# Patient Record
Sex: Male | Born: 1990 | Race: Black or African American | Hispanic: No | Marital: Single | Smoking: Never smoker
Health system: Southern US, Community
[De-identification: ages and names within clinical notes are randomized; demographics above are authoritative.]

---

## 2017-01-19 ENCOUNTER — Emergency Department (HOSPITAL_BASED_OUTPATIENT_CLINIC_OR_DEPARTMENT_OTHER): Payer: Managed Care, Other (non HMO)

## 2017-01-19 ENCOUNTER — Other Ambulatory Visit: Payer: Self-pay

## 2017-01-19 ENCOUNTER — Encounter (HOSPITAL_BASED_OUTPATIENT_CLINIC_OR_DEPARTMENT_OTHER): Payer: Self-pay

## 2017-01-19 ENCOUNTER — Emergency Department (HOSPITAL_BASED_OUTPATIENT_CLINIC_OR_DEPARTMENT_OTHER)
Admission: EM | Admit: 2017-01-19 | Discharge: 2017-01-19 | Disposition: A | Discharge status: Home / Self Care | Payer: Managed Care, Other (non HMO) | Attending: Emergency Medicine | Admitting: Emergency Medicine

## 2017-01-19 DIAGNOSIS — M25572 Pain in left ankle and joints of left foot: Secondary | ICD-10-CM

## 2017-01-19 DIAGNOSIS — R2242 Localized swelling, mass and lump, left lower limb: Secondary | ICD-10-CM | POA: Diagnosis not present

## 2017-01-19 MED ORDER — NAPROXEN 500 MG PO TABS: 500.0000 mg | ORAL_TABLET | Freq: Two times a day (BID) | ORAL | 0 refills | Status: DC

## 2017-01-19 NOTE — ED Provider Notes (Signed)
[facility] EMERGENCY DEPARTMENT Provider Note   CSN: [id number] Arrival date & time: 01/19/17  2245     History   Chief Complaint Chief Complaint  Patient presents with  . Ankle Pain    HPI Joel Yang is a 26 y.o. male.  Patient presents with acute onset of left ankle pain over the outside of his ankle causing him to limp starting upon waking this morning.  Patient denies any acute injury.  No redness or swelling.  No knee pain or hip pain.  No treatments prior to arrival.  Patient denies any acute injuries or overuse, strenuous activities recently.  No history of gout. The course is constant. Aggravating factors: bearing weight. Alleviating factors: none.        History reviewed. No pertinent past medical history.  There are no active problems to display for this patient.   History reviewed. No pertinent surgical history.     Home Medications    Prior to Admission medications   Not on File    Family History No family history on file.  Social History Social History   Tobacco Use  . Smoking status: Never Smoker  . Smokeless tobacco: Never Used  Substance Use Topics  . Alcohol use: No    Frequency: Never  . Drug use: No     Allergies   Patient has no known allergies.   Review of Systems Review of Systems  Constitutional: Negative for activity change.  Musculoskeletal: Positive for arthralgias and gait problem. Negative for back pain, joint swelling and neck pain.  Skin: Negative for wound.  Neurological: Negative for weakness and numbness.     Physical Exam Updated Vital Signs BP (!) 155/93 (BP Location: Left Arm)   Pulse 86   Temp 98.3 F (36.8 C) (Oral)   Resp 20   Ht 5\' 6"  (1.676 m)   Wt (!) 187.5 kg (413 lb 6.4 oz)   SpO2 98%   BMI 66.72 kg/m   Physical Exam  Constitutional: He appears well-developed and well-nourished.  HENT:  Head: Normocephalic and atraumatic.  Eyes: Conjunctivae are normal.  Neck: Normal  range of motion. Neck supple.  Cardiovascular:  Pulses:      Dorsalis pedis pulses are 2+ on the right side, and 2+ on the left side.       Posterior tibial pulses are 2+ on the right side, and 2+ on the left side.  Pulmonary/Chest: No respiratory distress.  Musculoskeletal: He exhibits edema and tenderness.  Neurological: He is alert.  Distal motor, sensation, and vascular intact.  Skin: Skin is warm and dry.  Psychiatric: He has a normal mood and affect.  Vitals reviewed.    ED Treatments / Results   Radiology Dg Ankle Complete Left  Result Date: 01/19/2017 CLINICAL DATA:  Pain EXAM: LEFT ANKLE COMPLETE - 3+ VIEW COMPARISON:  None. FINDINGS: Frontal, oblique, and lateral views were obtained. No fracture or joint effusion. No appreciable joint space narrowing or erosion. There is a small posterior calcaneal spur. The ankle mortise appears intact. IMPRESSION: Small posterior calcaneal spur. No fracture or apparent arthropathy. Ankle mortise appears intact. Electronically Signed   By: Bretta BangWilliam  Woodruff III M.D.   On: 01/19/2017 23:31    Procedures Procedures (including critical care time)  Medications Ordered in ED Medications - No data to display   Initial Impression / Assessment and Plan / ED Course  I have reviewed the triage vital signs and the nursing notes.  Pertinent labs & imaging results  that were available during my care of the patient were reviewed by me and considered in my medical decision making (see chart for details).     Patient seen and examined. Work-up initiated.  Vital signs reviewed and are as follows: BP (!) 155/93 (BP Location: Left Arm)   Pulse 86   Temp 98.3 F (36.8 C) (Oral)   Resp 20   Ht 5\' 6"  (1.676 m)   Wt (!) 187.5 kg (413 lb 6.4 oz)   SpO2 98%   BMI 66.72 kg/m   11:44 PM patient updated on results.  Will provide with Ace wrap prior to discharge.  Encourage PCP follow-up in 1 week if symptoms are not improved.  Home with NSAIDs, rice  protocol.  Final Clinical Impressions(s) / ED Diagnoses   Final diagnoses:  Acute left ankle pain   Patient with acute onset of ankle pain, no injury, negative imaging.  Likely overuse injury.  No signs of infection or joint infection.  Lower extremity is neurovascularly intact.  ED Discharge Orders        Ordered    naproxen (NAPROSYN) 500 MG tablet  2 times daily     01/19/17 2341       Renne CriglerGeiple, Giana Castner, PA-C 01/19/17 2345    Palumbo, April, MD 01/20/17 [phone]

## 2017-01-19 NOTE — ED Triage Notes (Signed)
Pt states he woke today with pain to left ankle-denies injury-NAD-steady limping gait

## 2017-01-19 NOTE — Discharge Instructions (Signed)
Please read and follow all provided instructions.  Your diagnoses today include:  1. Acute left ankle pain     Tests performed today include:  An x-ray of your ankle - does NOT show any broken bones  Vital signs. See below for your results today.   Medications prescribed:   Naproxen - anti-inflammatory pain medication  Do not exceed 500mg  naproxen every 12 hours, take with food  You have been prescribed an anti-inflammatory medication or NSAID. Take with food. Take smallest effective dose for the shortest duration needed for your pain. Stop taking if you experience stomach pain or vomiting.   Take any prescribed medications only as directed.  Home care instructions:   Follow any educational materials contained in this packet  Follow R.I.C.E. Protocol:  R - rest your injury   I  - use ice on injury without applying directly to skin  C - compress injury with bandage or splint  E - elevate the injury as much as possible  Follow-up instructions: Please follow-up with your primary care provider if you continue to have significant pain or trouble walking in 1 week. In this case you may have a severe sprain that requires further care.   Return instructions:   Please return if your toes are numb or tingling, appear gray or blue, or you have severe pain (also elevate leg and loosen splint or wrap)  Please return to the Emergency Department if you experience worsening symptoms.   Please return if you have any other emergent concerns.  Additional Information:  Your vital signs today were: BP (!) 155/93 (BP Location: Left Arm)    Pulse 86    Temp 98.3 F (36.8 C) (Oral)    Resp 20    Ht 5\' 6"  (1.676 m)    Wt (!) 187.5 kg (413 lb 6.4 oz)    SpO2 98%    BMI 66.72 kg/m  If your blood pressure (BP) was elevated above 135/85 this visit, please have this repeated by your doctor within one month. --------------

## 2017-08-13 ENCOUNTER — Other Ambulatory Visit: Payer: Self-pay

## 2017-08-13 ENCOUNTER — Encounter (HOSPITAL_BASED_OUTPATIENT_CLINIC_OR_DEPARTMENT_OTHER): Payer: Self-pay | Admitting: *Deleted

## 2017-08-13 ENCOUNTER — Emergency Department (HOSPITAL_BASED_OUTPATIENT_CLINIC_OR_DEPARTMENT_OTHER)
Admission: EM | Admit: 2017-08-13 | Discharge: 2017-08-13 | Disposition: A | Discharge status: Home / Self Care | Payer: Managed Care, Other (non HMO) | Attending: Emergency Medicine | Admitting: Emergency Medicine

## 2017-08-13 DIAGNOSIS — Z79899 Other long term (current) drug therapy: Secondary | ICD-10-CM | POA: Diagnosis not present

## 2017-08-13 DIAGNOSIS — R0981 Nasal congestion: Secondary | ICD-10-CM | POA: Insufficient documentation

## 2017-08-13 DIAGNOSIS — R51 Headache: Secondary | ICD-10-CM | POA: Diagnosis present

## 2017-08-13 MED ORDER — AMOXICILLIN-POT CLAVULANATE 875-125 MG PO TABS: 1.0000 | ORAL_TABLET | Freq: Two times a day (BID) | ORAL | 0 refills | Status: DC

## 2017-08-13 MED ORDER — BENZONATATE 100 MG PO CAPS: 100.0000 mg | ORAL_CAPSULE | Freq: Three times a day (TID) | ORAL | 0 refills | Status: AC

## 2017-08-13 MED ORDER — FLUTICASONE PROPIONATE 50 MCG/ACT NA SUSP: 1.0000 | Freq: Every day | NASAL | 0 refills | Status: AC

## 2017-08-13 NOTE — ED Notes (Signed)
ED Provider at bedside. 

## 2017-08-13 NOTE — ED Provider Notes (Signed)
[facility] EMERGENCY DEPARTMENT Provider Note   CSN: [id number] Arrival date & time: 08/13/17  1914     History   Chief Complaint Chief Complaint  Patient presents with  . Facial Pain    HPI Joel Yang is a 27 y.o. male who presents for evaluation of 2 days of facial pain, nasal congestion, rhinorrhea, cough.  He states that cough was initially productive of mucus but now is dry.  Patient reports he has had some nasal congestion that has progressed to postnasal drip.  He states that he can taste his mucus.  Patient reports he has not been taking any medications for symptoms.  Patient denies any history of asthma.  He is not a current smoker.  Patient denies any fever, chest pain, difficulty breathing, nausea/vomiting  The history is provided by the patient.    History reviewed. No pertinent past medical history.  There are no active problems to display for this patient.   History reviewed. No pertinent surgical history.      Home Medications    Prior to Admission medications   Medication Sig Start Date End Date Taking? Authorizing Provider  amoxicillin-clavulanate (AUGMENTIN) 875-125 MG tablet Take 1 tablet by mouth every 12 (twelve) hours. 08/13/17   Maxwell CaulLayden, Lindsey A, PA-C  benzonatate (TESSALON) 100 MG capsule Take 1 capsule (100 mg total) by mouth every 8 (eight) hours. 08/13/17   Maxwell CaulLayden, Lindsey A, PA-C  fluticasone (FLONASE) 50 MCG/ACT nasal spray Place 1 spray into both nostrils daily. 08/13/17   Maxwell CaulLayden, Lindsey A, PA-C  naproxen (NAPROSYN) 500 MG tablet Take 1 tablet (500 mg total) by mouth 2 (two) times daily. 01/19/17   Renne CriglerGeiple, Joshua, PA-C    Family History No family history on file.  Social History Social History   Tobacco Use  . Smoking status: Never Smoker  . Smokeless tobacco: Never Used  Substance Use Topics  . Alcohol use: No    Frequency: Never  . Drug use: No     Allergies   Patient has no known allergies.   Review of  Systems Review of Systems  Constitutional: Negative for fever.  HENT: Positive for congestion, rhinorrhea, sinus pressure and sinus pain.   Respiratory: Positive for cough. Negative for shortness of breath.   Cardiovascular: Negative for chest pain.  Gastrointestinal: Negative for nausea and vomiting.  All other systems reviewed and are negative.    Physical Exam Updated Vital Signs BP (!) 150/71 (BP Location: Left Arm)   Pulse 100   Temp 99.7 F (37.6 C) (Oral)   Resp 20   Ht 5\' 6"  (1.676 m)   Wt (!) 183.7 kg (405 lb)   SpO2 97%   BMI 65.37 kg/m   Physical Exam  Constitutional: He appears well-developed and well-nourished.  HENT:  Head: Normocephalic and atraumatic.  Nose: No mucosal edema. Right sinus exhibits maxillary sinus tenderness. Left sinus exhibits maxillary sinus tenderness.  Mouth/Throat: Uvula is midline, oropharynx is clear and moist and mucous membranes are normal.  Uvula is midline. No trismus. Airway is patent, phonation is intact. Tonsils are symmetric in appearance.   Eyes: Conjunctivae and EOM are normal. Right eye exhibits no discharge. Left eye exhibits no discharge. No scleral icterus.  Pulmonary/Chest: Effort normal and breath sounds normal.  Lungs clear to auscultation bilaterally.  Symmetric chest rise.  No wheezing, rales, rhonchi.  Neurological: He is alert.  Skin: Skin is warm and dry.  Psychiatric: He has a normal mood and affect. His speech is  normal and behavior is normal.  Nursing note and vitals reviewed.    ED Treatments / Results  Labs (all labs ordered are listed, but only abnormal results are displayed) Labs Reviewed - No data to display  EKG None  Radiology No results found.  Procedures Procedures (including critical care time)  Medications Ordered in ED Medications - No data to display   Initial Impression / Assessment and Plan / ED Course  I have reviewed the triage vital signs and the nursing notes.  Pertinent  labs & imaging results that were available during my care of the patient were reviewed by me and considered in my medical decision making (see chart for details).     27 year old male who presents for evaluation of 2 days of nasal congestion, rhinorrhea and sinus pressure.  No fevers, difficulty breathing.  Also reports cough.  Now is been dry.  No nausea/vomiting. Patient is afebrile, non-toxic appearing, sitting comfortably on examination table. Vital signs reviewed and stable.  Patient is slightly hypertensive.  Does not have a history of hypertension.  Instructed to follow-up with his primary care or the outpatient [facility] for reevaluation of his blood pressure.  On exam, he does have some sinus tenderness palpation noted to the maxillary sinuses bilaterally.  Exam otherwise unremarkable.  Suspect viral URI.  Also concern for viral sinusitis.  Given duration of symptoms, do not suspect bacterial sinusitis.  History/physical exam is not concerning for peritonsillar abscess, Ludwig angina, pneumonia.  I discussed with patient that this is most likely viral in nature.  He states he was very concerned about possible infection.  I expressed to him that this was not concerning for bacterial infection given duration of symptoms and reassuring exam.  Suggested that he start with symptomatic relief such as Flonase and Tessalon Perles for his symptoms.  After discussion with patient, we will plan to write him for Augmentin for possible sinusitis but instructed him not to take it unless his symptoms last greater than 10 days or he worsens.  Patient expresses understanding and agreement. Patient had ample opportunity for questions and discussion. All patient's questions were answered with full understanding. Strict return precautions discussed. Patient expresses understanding and agreement to plan.   Final Clinical Impressions(s) / ED Diagnoses   Final diagnoses:  Nasal congestion    ED Discharge  Orders        Ordered    fluticasone (FLONASE) 50 MCG/ACT nasal spray  Daily     08/13/17 2015    benzonatate (TESSALON) 100 MG capsule  Every 8 hours     08/13/17 2015    amoxicillin-clavulanate (AUGMENTIN) 875-125 MG tablet  Every 12 hours     08/13/17 2015       Maxwell Caul, PA-C 08/13/17 2026    Vanetta Mulders, MD 08/14/17 [phone]

## 2017-08-13 NOTE — Discharge Instructions (Signed)
Take Flonase for nasal congestion.   Take Tessalon perles as directed for cough.   As we discussed, at this time, do not believe that your source is infectious that would require antibiotics.  As we discussed I will provide you the antibiotic but do not fill it unless your symptoms last greater than 10 days or recent running high fever.  I discussed your blood pressure was high here in the department.  Please have this reevaluated by a primary care doctor or the referred [facility] for further evaluation.  Return to emergency department for any fever, difficulty breathing, worsening cough, chest pain or any other worsening or concerning symptoms.

## 2017-08-13 NOTE — ED Triage Notes (Signed)
Pt reports facial pain and "I can taste and smell my mucous" x 2-3 days

## 2019-06-14 IMAGING — CR DG ANKLE COMPLETE 3+V*L*
3 series · 3 of 3 positions shown · non-contrast
Comparison: None.

CLINICAL DATA: Pain

EXAM:
LEFT ANKLE COMPLETE - 3+ VIEW

[t ankle joint ap left]
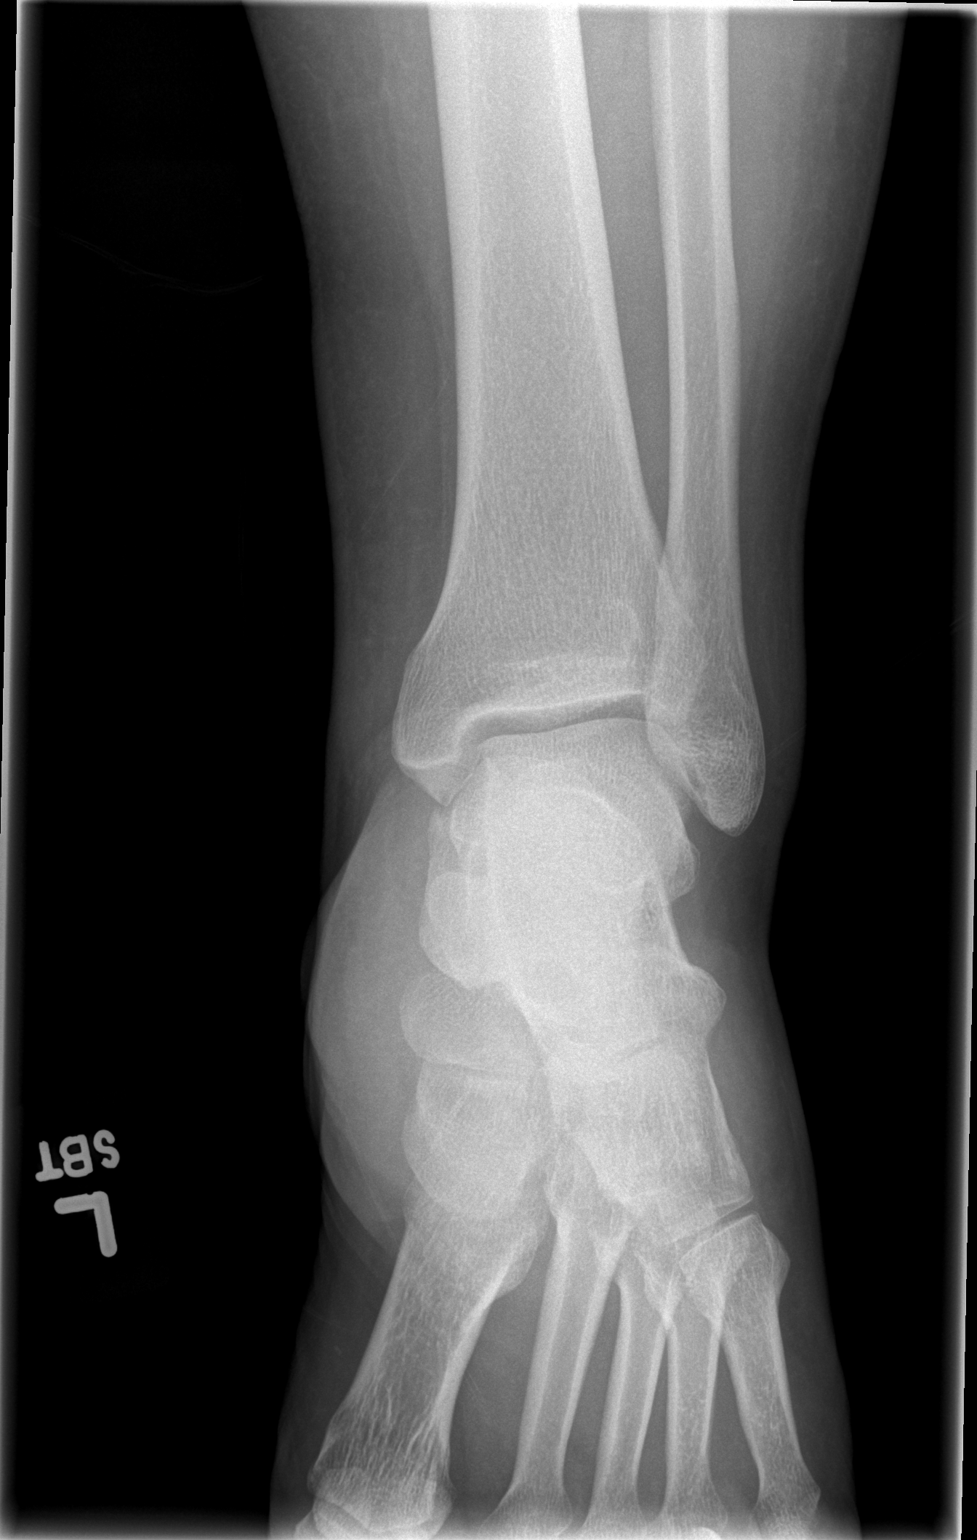

[t ankle joint oblique left]
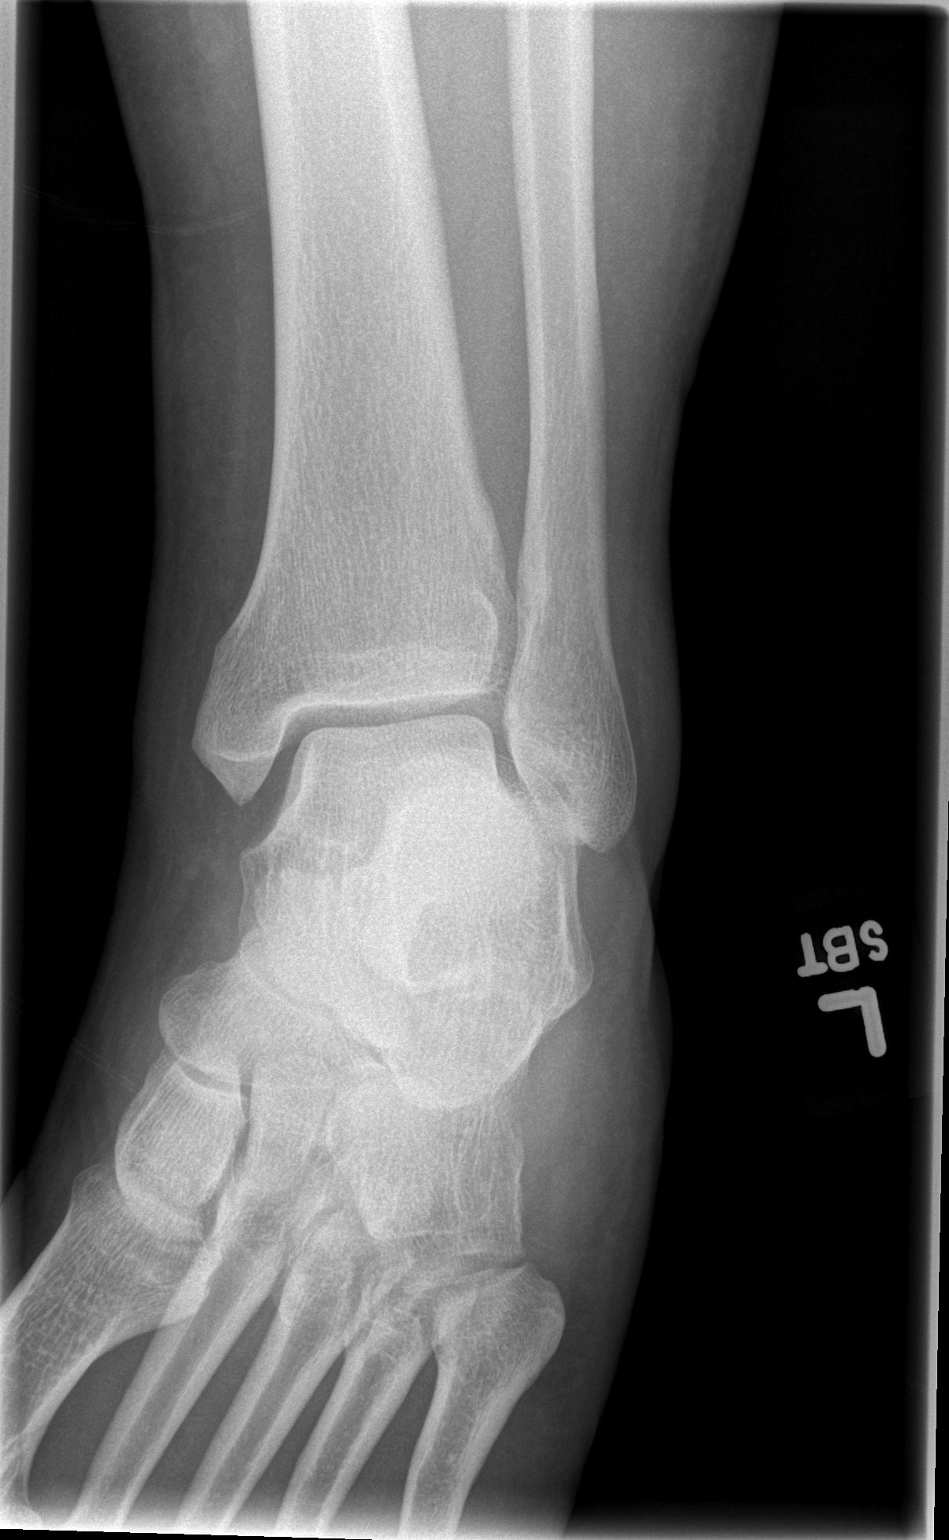

[t ankle joint lat left]
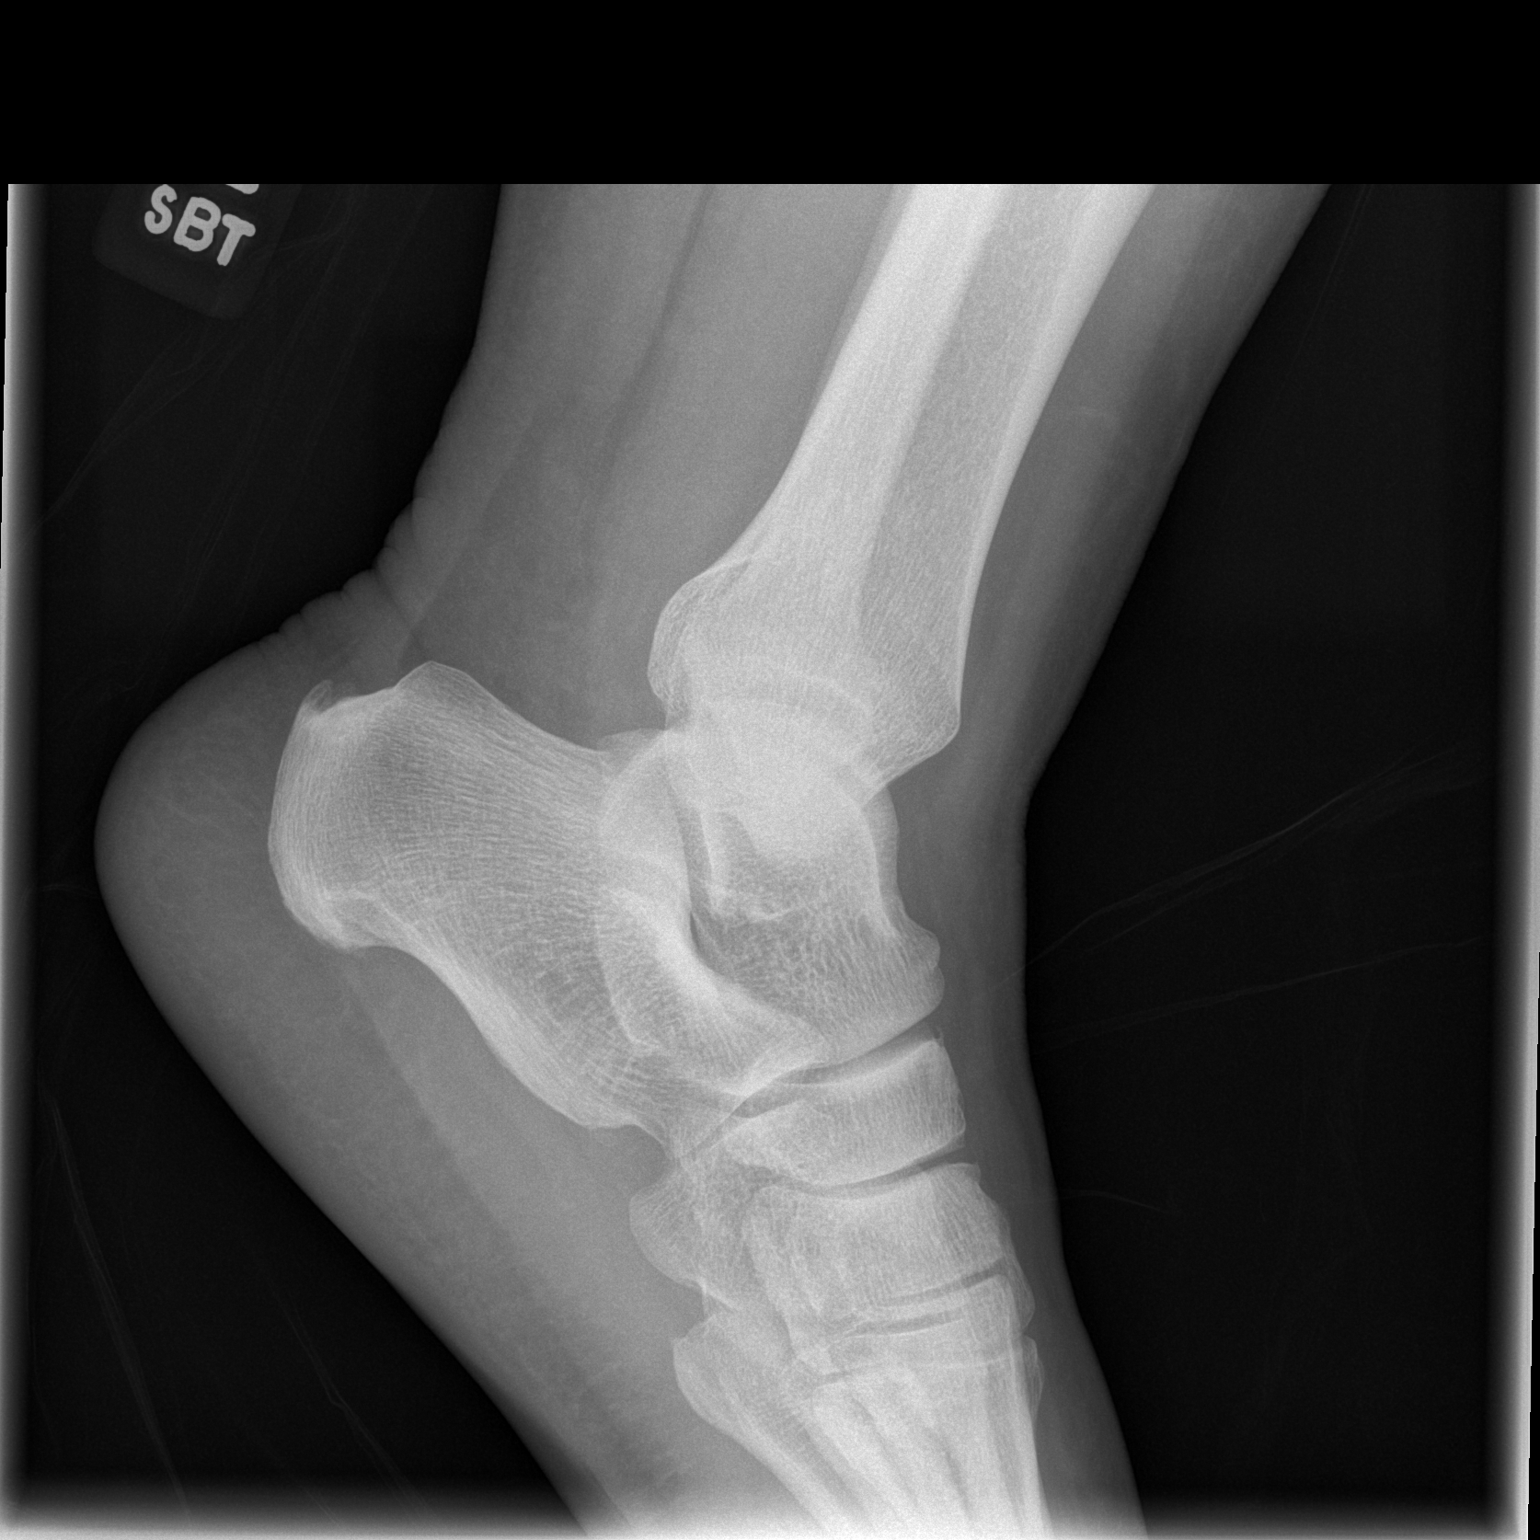

[3 of 3 positions shown; findings below may reference images not displayed]

FINDINGS: Frontal, oblique, and lateral views were obtained. No fracture or
joint effusion. No appreciable joint space narrowing or erosion.
There is a small posterior calcaneal spur. The ankle mortise appears
intact.
IMPRESSION: Small posterior calcaneal spur. No fracture or apparent arthropathy.
Ankle mortise appears intact.

## 2022-12-14 ENCOUNTER — Other Ambulatory Visit: Payer: Self-pay

## 2022-12-14 ENCOUNTER — Encounter (HOSPITAL_BASED_OUTPATIENT_CLINIC_OR_DEPARTMENT_OTHER): Payer: Self-pay | Admitting: Emergency Medicine

## 2022-12-14 ENCOUNTER — Emergency Department (HOSPITAL_BASED_OUTPATIENT_CLINIC_OR_DEPARTMENT_OTHER): Payer: Managed Care, Other (non HMO)

## 2022-12-14 DIAGNOSIS — M779 Enthesopathy, unspecified: Secondary | ICD-10-CM | POA: Diagnosis not present

## 2022-12-14 DIAGNOSIS — M79671 Pain in right foot: Secondary | ICD-10-CM | POA: Diagnosis present

## 2022-12-14 NOTE — ED Triage Notes (Signed)
Pt c/o RT foot/ankle pain since yesterday; no injury; pain is to anterior ankle

## 2022-12-15 ENCOUNTER — Emergency Department (HOSPITAL_BASED_OUTPATIENT_CLINIC_OR_DEPARTMENT_OTHER)
Admission: EM | Admit: 2022-12-15 | Discharge: 2022-12-15 | Disposition: A | Discharge status: Home / Self Care | Payer: BC Managed Care – PPO | Attending: Emergency Medicine | Admitting: Emergency Medicine

## 2022-12-15 DIAGNOSIS — M7750 Other enthesopathy of unspecified foot: Secondary | ICD-10-CM

## 2022-12-15 MED ORDER — IBUPROFEN 400 MG PO TABS
600.0000 mg | ORAL_TABLET | Freq: Once | ORAL | Status: AC
  Administered 2022-12-15: 600 mg via ORAL
  Filled 2022-12-15: qty 1

## 2022-12-15 NOTE — ED Provider Notes (Signed)
   [place] EMERGENCY DEPARTMENT AT [facility]  Provider Note  CSN: [id number] Arrival date & time: 12/14/22 2209  History Chief Complaint  Patient presents with   Foot Pain    Jalal Overgaard is a 32 y.o. male reports 2 days of worsening pain in R foot, today having difficulty bearing weight. He does not recall a specific injury, but he was playing basketball the day before.    Home Medications Prior to Admission medications   Medication Sig Start Date End Date Taking? Authorizing Provider  benzonatate (TESSALON) 100 MG capsule Take 1 capsule (100 mg total) by mouth every 8 (eight) hours. 08/13/17   Maxwell Caul, PA-C  fluticasone (FLONASE) 50 MCG/ACT nasal spray Place 1 spray into both nostrils daily. 08/13/17   Maxwell Caul, PA-C     Allergies    Patient has no known allergies.   Review of Systems   Review of Systems Please see HPI for pertinent positives and negatives  Physical Exam BP (!) 172/88 (BP Location: Left Arm)   Pulse 82   Temp 98.9 F (37.2 C)   Resp 18   Ht 5\' 6"  (1.676 m)   Wt (!) 195 kg   SpO2 97%   BMI 69.40 kg/m   Physical Exam Vitals and nursing note reviewed.  Constitutional:      Appearance: He is obese.  HENT:     Head: Normocephalic.     Nose: Nose normal.  Eyes:     Extraocular Movements: Extraocular movements intact.  Pulmonary:     Effort: Pulmonary effort is normal.  Musculoskeletal:        General: Tenderness (dorsal R foot) present. No swelling or deformity. Normal range of motion.     Cervical back: Neck supple.  Skin:    Findings: No rash (on exposed skin).  Neurological:     Mental Status: He is alert and oriented to person, place, and time.  Psychiatric:        Mood and Affect: Mood normal.     ED Results / Procedures / Treatments   EKG None  Procedures Procedures  Medications Ordered in the ED Medications  ibuprofen (ADVIL) tablet 600 mg (has no administration in time range)    Initial  Impression and Plan  Patient here with R foot pain, benign exam, I personally viewed the images from radiology studies and agree with radiologist interpretation: Xrays are neg. Likely a tendonitis. Will treat with NSAIDs, immobilization, rest, ice and elevation. PCP follow up, RTED for any other concerns.    ED Course       MDM Rules/Calculators/A&P Medical Decision Making Problems Addressed: Tendonitis of ankle or foot: acute illness or injury  Amount and/or Complexity of Data Reviewed Radiology: ordered and independent interpretation performed. Decision-making details documented in ED Course.  Risk OTC drugs.     Final Clinical Impression(s) / ED Diagnoses Final diagnoses:  Tendonitis of ankle or foot    Rx / DC Orders ED Discharge Orders     None        Pollyann Savoy, MD 12/15/22 [phone]
